# Patient Record
Sex: Male | Born: 1955 | Race: Black or African American | Hispanic: No | Marital: Married | Smoking: Never smoker
Health system: Southern US, Community
[De-identification: ages and names within clinical notes are randomized; demographics above are authoritative.]

## PROBLEM LIST (undated history)

## (undated) DIAGNOSIS — E119 Type 2 diabetes mellitus without complications: Secondary | ICD-10-CM

---

## 2012-12-17 ENCOUNTER — Encounter (HOSPITAL_BASED_OUTPATIENT_CLINIC_OR_DEPARTMENT_OTHER): Payer: Self-pay

## 2012-12-17 ENCOUNTER — Inpatient Hospital Stay (HOSPITAL_BASED_OUTPATIENT_CLINIC_OR_DEPARTMENT_OTHER)
Admission: EM | Admit: 2012-12-17 | Discharge: 2012-12-20 | DRG: 584 | Disposition: A | Discharge status: Home Health | Payer: BC Managed Care – PPO | Attending: Internal Medicine | Admitting: Internal Medicine

## 2012-12-17 ENCOUNTER — Emergency Department (HOSPITAL_BASED_OUTPATIENT_CLINIC_OR_DEPARTMENT_OTHER): Payer: BC Managed Care – PPO

## 2012-12-17 DIAGNOSIS — D509 Iron deficiency anemia, unspecified: Secondary | ICD-10-CM

## 2012-12-17 DIAGNOSIS — J189 Pneumonia, unspecified organism: Secondary | ICD-10-CM | POA: Diagnosis present

## 2012-12-17 DIAGNOSIS — A419 Sepsis, unspecified organism: Principal | ICD-10-CM | POA: Diagnosis present

## 2012-12-17 DIAGNOSIS — E876 Hypokalemia: Secondary | ICD-10-CM | POA: Diagnosis present

## 2012-12-17 DIAGNOSIS — E872 Acidosis, unspecified: Secondary | ICD-10-CM | POA: Diagnosis present

## 2012-12-17 DIAGNOSIS — E119 Type 2 diabetes mellitus without complications: Secondary | ICD-10-CM

## 2012-12-17 DIAGNOSIS — Z794 Long term (current) use of insulin: Secondary | ICD-10-CM

## 2012-12-17 DIAGNOSIS — Z79899 Other long term (current) drug therapy: Secondary | ICD-10-CM

## 2012-12-17 DIAGNOSIS — D72829 Elevated white blood cell count, unspecified: Secondary | ICD-10-CM | POA: Diagnosis present

## 2012-12-17 HISTORY — DX: Type 2 diabetes mellitus without complications: E11.9

## 2012-12-17 LAB — BASIC METABOLIC PANEL
CO2: 24 mEq/L (ref 19–32)
Calcium: 9.2 mg/dL (ref 8.4–10.5)
Chloride: 89 mEq/L — ABNORMAL LOW (ref 96–112)
Creatinine, Ser: 1 mg/dL (ref 0.50–1.35)
Glucose, Bld: 375 mg/dL — ABNORMAL HIGH (ref 70–99)

## 2012-12-17 LAB — GLUCOSE, CAPILLARY: Glucose-Capillary: 276 mg/dL — ABNORMAL HIGH (ref 70–99)

## 2012-12-17 LAB — CBC WITH DIFFERENTIAL/PLATELET
Eosinophils Absolute: 0 10*3/uL (ref 0.0–0.7)
Eosinophils Relative: 0 % (ref 0–5)
HCT: 36.4 % — ABNORMAL LOW (ref 39.0–52.0)
Hemoglobin: 12.2 g/dL — ABNORMAL LOW (ref 13.0–17.0)
Lymphs Abs: 1.1 10*3/uL (ref 0.7–4.0)
MCH: 22.3 pg — ABNORMAL LOW (ref 26.0–34.0)
MCV: 66.5 fL — ABNORMAL LOW (ref 78.0–100.0)
Monocytes Relative: 7 % (ref 3–12)
RBC: 5.47 MIL/uL (ref 4.22–5.81)

## 2012-12-17 LAB — CG4 I-STAT (LACTIC ACID): Lactic Acid, Venous: 3.16 mmol/L — ABNORMAL HIGH (ref 0.5–2.2)

## 2012-12-17 MED ORDER — DEXTROSE 5 % IV SOLN
1.0000 g | Freq: Once | INTRAVENOUS | Status: AC
  Administered 2012-12-17: 1 g via INTRAVENOUS
  Filled 2012-12-17: qty 10

## 2012-12-17 MED ORDER — SODIUM CHLORIDE 0.9 % IV SOLN
INTRAVENOUS | Status: AC
  Administered 2012-12-17: 20:00:00 via INTRAVENOUS

## 2012-12-17 MED ORDER — IOHEXOL 350 MG/ML SOLN
100.0000 mL | Freq: Once | INTRAVENOUS | Status: AC | PRN
  Administered 2012-12-17: 100 mL via INTRAVENOUS

## 2012-12-17 MED ORDER — INSULIN ASPART 100 UNIT/ML ~~LOC~~ SOLN
0.0000 [IU] | Freq: Every day | SUBCUTANEOUS | Status: DC
  Administered 2012-12-17: 3 [IU] via SUBCUTANEOUS

## 2012-12-17 MED ORDER — DEXTROSE 5 % IV SOLN
500.0000 mg | INTRAVENOUS | Status: DC
  Administered 2012-12-18 – 2012-12-19 (×2): 500 mg via INTRAVENOUS
  Filled 2012-12-17 (×3): qty 500

## 2012-12-17 MED ORDER — ACETAMINOPHEN 325 MG PO TABS
650.0000 mg | ORAL_TABLET | Freq: Four times a day (QID) | ORAL | Status: DC | PRN
  Administered 2012-12-17 – 2012-12-19 (×2): 650 mg via ORAL
  Filled 2012-12-17 (×2): qty 2

## 2012-12-17 MED ORDER — ACETAMINOPHEN 325 MG PO TABS
650.0000 mg | ORAL_TABLET | Freq: Once | ORAL | Status: AC
  Administered 2012-12-17: 650 mg via ORAL

## 2012-12-17 MED ORDER — INSULIN ASPART 100 UNIT/ML ~~LOC~~ SOLN
0.0000 [IU] | Freq: Three times a day (TID) | SUBCUTANEOUS | Status: DC
  Administered 2012-12-18 (×2): 3 [IU] via SUBCUTANEOUS
  Administered 2012-12-18: 2 [IU] via SUBCUTANEOUS
  Administered 2012-12-19: 3 [IU] via SUBCUTANEOUS
  Administered 2012-12-19 (×2): 5 [IU] via SUBCUTANEOUS
  Administered 2012-12-20 (×3): 3 [IU] via SUBCUTANEOUS

## 2012-12-17 MED ORDER — DEXTROSE 5 % IV SOLN
1.0000 g | INTRAVENOUS | Status: DC
  Administered 2012-12-18 – 2012-12-19 (×2): 1 g via INTRAVENOUS
  Filled 2012-12-17 (×3): qty 10

## 2012-12-17 MED ORDER — SODIUM CHLORIDE 0.9 % IV SOLN
1000.0000 mL | INTRAVENOUS | Status: DC
  Administered 2012-12-17 – 2012-12-19 (×5): 1000 mL via INTRAVENOUS

## 2012-12-17 MED ORDER — SODIUM CHLORIDE 0.9 % IV SOLN
1000.0000 mL | Freq: Once | INTRAVENOUS | Status: AC
  Administered 2012-12-17: 1000 mL via INTRAVENOUS

## 2012-12-17 MED ORDER — DEXTROSE 5 % IV SOLN
500.0000 mg | Freq: Once | INTRAVENOUS | Status: AC
  Administered 2012-12-17: 500 mg via INTRAVENOUS
  Filled 2012-12-17: qty 500

## 2012-12-17 MED ORDER — ACETAMINOPHEN 325 MG PO TABS
650.0000 mg | ORAL_TABLET | Freq: Once | ORAL | Status: AC
  Administered 2012-12-17: 650 mg via ORAL
  Filled 2012-12-17: qty 2

## 2012-12-17 MED ORDER — ACETAMINOPHEN 325 MG PO TABS
ORAL_TABLET | ORAL | Status: AC
  Administered 2012-12-17: 650 mg via ORAL
  Filled 2012-12-17: qty 2

## 2012-12-17 MED ORDER — HEPARIN SODIUM (PORCINE) 5000 UNIT/ML IJ SOLN
5000.0000 [IU] | Freq: Three times a day (TID) | INTRAMUSCULAR | Status: DC
  Administered 2012-12-17 – 2012-12-20 (×8): 5000 [IU] via SUBCUTANEOUS
  Filled 2012-12-17 (×11): qty 1

## 2012-12-17 MED ORDER — SODIUM CHLORIDE 0.9 % IV BOLUS (SEPSIS)
1000.0000 mL | Freq: Once | INTRAVENOUS | Status: AC
  Administered 2012-12-17: 1000 mL via INTRAVENOUS

## 2012-12-17 NOTE — ED Notes (Signed)
Patient reports that he awoke feeling weak and tired. Since awakening has had 3 episodes of spitting up frank blood. No history of same. Denies noticing any blood in stool

## 2012-12-17 NOTE — Significant Event (Signed)
Called by Dr. Rosalia Hammers at [facility] Patient presented with 2-3/4 cirterion for sepsis, source likely pulmonary, has pink coloured sputum, fever chills Temp 103 on admit Blood pressure better after 2 liter Per Dr. Rosalia Hammers, patient mentating well, seems stable for SDU Discussed cas ewioth Dr. Herma Carson of C[facility]-states he might be stable enough tfor SDU based on parameters discussed- advises to call [facility] if patient decompensates   Accepted SDU, Tm 10--FM made aware  Pleas Koch, MD [facility] [phone]

## 2012-12-17 NOTE — ED Notes (Signed)
Jimmy Cruz is on metformin.  He was instructed to DC metformin until after 3pm Tuesday afternoon.  Jimmy Cruz was given a reminder sheet to take home  and I also documented it in his chart.

## 2012-12-17 NOTE — H&P (Signed)
[facility] History and Physical  Jimmy Cruz ZOX:[id number] DOB: [birth year] DOA: 12/17/2012  Referring physician: ED PCP: No primary provider on file.   Chief Complaint: Cough, hemoptysis  HPI: Jimmy Cruz is a 57 y.o. male who presents to the ED at [facility] with fever and hemoptysis.  Symptoms onset yesterday after getting home from work.  Started with fever and chills, then turned into hemoptysis at around 0500 this morning.  In the ED he was found to have a LLL PNA, CT ruled out PE, patient was also septic with fever 103.1, P 128, tachypnea.  His BP was initially borderline low at 99/63 this improved with IVF, he was transferred to SDU at [facility] for further management.  Review of Systems: 12 systems reviewed and otherwise negative.  Past Medical History  Diagnosis Date  . Diabetes mellitus without complication    History reviewed. No pertinent past surgical history. Social History:  reports that he has never smoked. He does not have any smokeless tobacco history on file. He reports that he does not drink alcohol. His drug history is not on file. Works as [employer].  No Known Allergies  No family history on file. No recent illness in family.  Prior to Admission medications   Medication Sig Start Date End Date Taking? Authorizing Provider  metFORMIN (GLUCOPHAGE) 500 MG tablet Take 500 mg by mouth 2 (two) times daily with a meal.   Yes Historical Provider, MD  sitaGLIPtin (JANUVIA) 50 MG tablet Take 50 mg by mouth daily.    Yes Historical Provider, MD   Physical Exam: Filed Vitals:   12/17/12 1900  BP: 104/73  Pulse: 113  Temp: 99.3 F (37.4 C)  Resp: 18    General:  NAD, resting comfortably in bed Eyes: PEERLA EOMI ENT: mucous membranes moist Neck: supple w/o JVD Cardiovascular: tachycardic, regular, w/o MRG Respiratory: CTA B Abdomen: soft, nt, nd, bs+ Skin: no rash nor lesion Musculoskeletal: MAE, full ROM all 4 extremities Psychiatric: normal tone and  affect Neurologic: AAOx3, grossly non-focal  Labs on Admission:  Basic Metabolic Panel:  Recent Labs Lab 12/17/12 1250  NA 128*  K 3.8  CL 89*  CO2 24  GLUCOSE 375*  BUN 17  CREATININE 1.00  CALCIUM 9.2   Liver Function Tests: No results found for this basename: AST, ALT, ALKPHOS, BILITOT, PROT, ALBUMIN,  in the last 168 hours No results found for this basename: LIPASE, AMYLASE,  in the last 168 hours No results found for this basename: AMMONIA,  in the last 168 hours CBC:  Recent Labs Lab 12/17/12 1250  WBC 11.4*  NEUTROABS 9.5*  HGB 12.2*  HCT 36.4*  MCV 66.5*  PLT 146*   Cardiac Enzymes:  Recent Labs Lab 12/17/12 1250  TROPONINI <0.30    BNP (last 3 results) No results found for this basename: PROBNP,  in the last 8760 hours CBG:  Recent Labs Lab 12/17/12 1648  GLUCAP 351*    Radiological Exams on Admission: Dg Chest 2 View  12/17/2012   *RADIOLOGY REPORT*  Clinical Data: Hemoptysis.  CHEST - 2 VIEW  Comparison: None.  Findings: Lungs are adequately inflated with consolidation over the posterior left lower lobe.  The no definite effusion and no evidence of pneumothorax.  Cardiomediastinal silhouette is within normal.  Remaining bones and soft tissues are within normal.  IMPRESSION: Nonspecific consolidation over the posterior left lower lobe.  This may be due to infection versus hemorrhage and less likely neoplasm or infarction.   Original  Report Authenticated By: Elberta Fortis, M.D.   Ct Angio Chest Pe W/cm &/or Wo Cm  12/17/2012   *RADIOLOGY REPORT*  Clinical Data: Hemoptysis.  CT ANGIOGRAPHY CHEST  Technique:  Multidetector CT imaging of the chest using the standard protocol during bolus administration of intravenous contrast. Multiplanar reconstructed images including MIPs were obtained and reviewed to evaluate the vascular anatomy.  Contrast: OMNIPAQUE IOHEXOL 350 MG/ML SOLN  Comparison: Chest x-ray today  Findings: Lungs are well inflated and  demonstrate moderate consolidation with air bronchograms over most of the basilar segments of the left lower lobe.  No evidence of effusion. Findings are likely due to infection.  Heart is normal in size. There is no evidence of pulmonary embolism.  There is no mediastinal, hilar or axillary adenopathy.  Remaining mediastinal structures are unremarkable.  Images through the upper abdomen are within normal.  IMPRESSION: Moderate consolidation with air bronchograms over the left lower lobe likely infection. Recommend follow-up to resolution.  No evidence of pulmonary embolism.   Original Report Authenticated By: Elberta Fortis, M.D.    EKG: Independently reviewed.  Assessment/Plan Principal Problem:   CAP (community acquired pneumonia) Active Problems:   DM2 (diabetes mellitus, type 2)   Sepsis   1. CAP with sepsis - patient with apparent LLL PNA, treating as CAP, got rocephin and azithro at [facility], continuing this, on PNA pathway.  Tylenol for fever, got 2L IVF already, NS at 150 cc/hr for first 12 H then 125 cc/hr after that.  Fever down to 99.3 from 103.1 initially, tachycardia down to 113 from 128 initially.  Sating 99% on [place].  Lung sounds unimpressive at this point, suppress cough if it becomes too severe but dosent seem overly severe at this time.  Blood and sputum cultures pending. 2. DM2 - holding home PO meds and putting him on med dose SSI AC/HS, adding an HS correction dose for this evening since BGL was 351 most recently. 3. Lactic acidosis - q6h lactates ordered, next due at 11pm.    Code Status: Full Code (must indicate code status--if unknown or must be presumed, indicate so) Family Communication: Spoke with wife at bedside (indicate person spoken with, if applicable, with phone number if by telephone) Disposition Plan: Admit to inpatient (indicate anticipated LOS)  Time spent: 70 min  Radek Carnero M. [facility] Pager [phone]  If 7PM-7AM, please contact  night-coverage [email or web address] Password [email or web address] 12/17/2012, 8:02 PM

## 2012-12-17 NOTE — ED Provider Notes (Signed)
CSN: [id number]     Arrival date & time 12/17/12  1052 History   First MD Initiated Contact with Patient 12/17/12 1158     Chief Complaint  Patient presents with  . Hemoptysis   (Consider location/radiation/quality/duration/timing/severity/associated sxs/prior Treatment) HPI  57 y.o. Male with niddm presents complaining of fever and hemoptysis.  Patient worked yesterday felt ok, got home and had fever (subjective), then chills.  No headache, sore throat, n//v/d until 0500 when he got up and coughed with bloody sputum with mucous.  PMD is in [place].  No smoking.  No hsitory of dvt, pe, denies swelling in legs but states pain in legs comes and goes, denies any today.  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    Past Medical History  Diagnosis Date  . Diabetes mellitus without complication    History reviewed. No pertinent past surgical history. No family history on file. History  Substance Use Topics  . Smoking status: Never Smoker   . Smokeless tobacco: Not on file  . Alcohol Use: No    Review of Systems  All other systems reviewed and are negative.    Allergies  Review of patient's allergies indicates no known allergies.  Home Medications   Current Outpatient Rx  Name  Route  Sig  Dispense  Refill  . metFORMIN (GLUCOPHAGE) 500 MG tablet   Oral   Take 500 mg by mouth 2 (two) times daily with a meal.         . sitaGLIPtin (JANUVIA) 50 MG tablet   Oral   Take by mouth daily.          BP 105/69  Pulse 128  Temp(Src) 103.1 F (39.5 C) (Oral)  Resp 20  Wt 172 lb 6.4 oz (78.2 kg)  SpO2 92% Physical Exam  Nursing note and vitals reviewed. Constitutional: He is oriented to person, place, and time. He appears well-developed and well-nourished.  HENT:  Head: Normocephalic and atraumatic.  Right Ear: External ear normal.  Left Ear: External ear normal.  Nose: Nose normal.  Mouth/Throat: Oropharynx is clear and moist.   Eyes: Conjunctivae and EOM are normal. Pupils are equal, round, and reactive to light.  Neck: Normal range of motion. Neck supple.  Cardiovascular: Tachycardia present.   Pulmonary/Chest: Effort normal.  Abdominal: Soft. Bowel sounds are normal.  Musculoskeletal: Normal range of motion.  Neurological: He is alert and oriented to person, place, and time. He has normal reflexes.  Skin: Skin is warm and dry.  Psychiatric: He has a normal mood and affect. His behavior is normal. Thought content normal.    ED Course  Procedures (including critical care time) Labs Review Labs Reviewed  CULTURE, BLOOD (ROUTINE X 2)  CULTURE, BLOOD (ROUTINE X 2)  CBC WITH DIFFERENTIAL  BASIC METABOLIC PANEL   Imaging Review Dg Chest 2 View  12/17/2012   *RADIOLOGY REPORT*  Clinical Data: Hemoptysis.  CHEST - 2 VIEW  Comparison: None.  Findings: Lungs are adequately inflated with consolidation over the posterior left lower lobe.  The no definite effusion and no evidence of pneumothorax.  Cardiomediastinal silhouette is within normal.  Remaining bones and soft tissues are within normal.  IMPRESSION: Nonspecific consolidation over the posterior left lower lobe.  This may be due to infection versus hemorrhage and less likely neoplasm or infarction.   Original Report Authenticated By: Elberta Fortis, M.D.   Results for orders placed during the hospital encounter of 12/17/12  CBC  WITH DIFFERENTIAL      Result Value Range   WBC 11.4 (*) 4.0 - 10.5 K/uL   RBC 5.47  4.22 - 5.81 MIL/uL   Hemoglobin 12.2 (*) 13.0 - 17.0 g/dL   HCT 16.1 (*) 09.6 - 04.5 %   MCV 66.5 (*) 78.0 - 100.0 fL   MCH 22.3 (*) 26.0 - 34.0 pg   MCHC 33.5  30.0 - 36.0 g/dL   RDW 40.9  81.1 - 91.4 %   Platelets 146 (*) 150 - 400 K/uL   Neutrophils Relative % 84 (*) 43 - 77 %   Neutro Abs 9.5 (*) 1.7 - 7.7 K/uL   Lymphocytes Relative 10 (*) 12 - 46 %   Lymphs Abs 1.1  0.7 - 4.0 K/uL   Monocytes Relative 7  3 - 12 %   Monocytes Absolute 0.8  0.1  - 1.0 K/uL   Eosinophils Relative 0  0 - 5 %   Eosinophils Absolute 0.0  0.0 - 0.7 K/uL   Basophils Relative 0  0 - 1 %   Basophils Absolute 0.0  0.0 - 0.1 K/uL   RBC Morphology POLYCHROMASIA PRESENT    BASIC METABOLIC PANEL      Result Value Range   Sodium 128 (*) 135 - 145 mEq/L   Potassium 3.8  3.5 - 5.1 mEq/L   Chloride 89 (*) 96 - 112 mEq/L   CO2 24  19 - 32 mEq/L   Glucose, Bld 375 (*) 70 - 99 mg/dL   BUN 17  6 - 23 mg/dL   Creatinine, Ser 7.82  0.50 - 1.35 mg/dL   Calcium 9.2  8.4 - 95.6 mg/dL   GFR calc non Af Amer 83 (*) >90 mL/min   GFR calc Af Amer >90  >90 mL/min  PROTIME-INR      Result Value Range   Prothrombin Time 15.2  11.6 - 15.2 seconds   INR 1.23  0.00 - 1.49  TROPONIN I      Result Value Range   Troponin I <0.30  <0.30 ng/mL  GLUCOSE, CAPILLARY      Result Value Range   Glucose-Capillary 351 (*) 70 - 99 mg/dL   Comment 1 Notify RN    CG4 I-STAT (LACTIC ACID)      Result Value Range   Lactic Acid, Venous 3.16 (*) 0.5 - 2.2 mmol/L  CG4 I-STAT (LACTIC ACID)      Result Value Range   Lactic Acid, Venous 3.98 (*) 0.5 - 2.2 mmol/L   Filed Vitals:   12/17/12 1639  BP:   Pulse:   Temp: 102.2 F (39 C)  Resp:    Filed Vitals:   12/17/12 1530 12/17/12 1557 12/17/12 1630 12/17/12 1639  BP: 132/82  99/63   Pulse: 123  121   Temp:  101.5 F (38.6 C)  102.2 F (39 C)  TempSrc:  Oral  Oral  Resp: 30  0   Weight:      SpO2: 95%  99%      Mr. Macrae is a 57 year old male with a history of diabetes who presents today with fever starting last night and cough that began in the early a.m. He has a left lower lobe pneumonia, tachycardia, and fever consistent with sepsis.  The patient was treated with the standard medications for community-acquired pneumonia of Rocephin and Zithromax. Patient has received 3 L of fluid. His heart rate has come down with temperature defervescence  his heart rate was initially 1:30 he  has decreased to 117. He has had some  intermittent increase in temperature back up to 102.8 but again received Tylenol and body temperature is decreasing. Heart rate has been currently decreased to 117. Systolic blood pressure has ranged 99-120. Most recent systolic blood pressure was 117. He has received a total of 3.4 L at this time.  Patient's care was discussed with Dr. Mahala Menghini and arrangements have been made for the patient to be transferred to the step down unit at [facility]. I have continued close observation of the patient to assess for change in neurological status, tachycardia, and hypotension. Repeat lactic acid obtained is 3.98. Awaiting a repeat blood sugar. Patient has remained relatively stable and although meeting criteria for sepsis is not hypotensive at this time. He has good peripheral IV access and plans are to transfer him to step down but he is being carefully observed for possible need for ICU.  CRITICAL CARE Performed by: Hilario Quarry Total critical care time: 60 Critical care time was exclusive of separately billable procedures and treating other patients. Critical care was necessary to treat or prevent imminent or life-threatening deterioration. Critical care was time spent personally by me on the following activities: development of treatment plan with patient and/or surrogate as well as nursing, discussions with consultants, evaluation of patient's response to treatment, examination of patient, obtaining history from patient or surrogate, ordering and performing treatments and interventions, ordering and review of laboratory studies, ordering and review of radiographic studies, pulse oximetry and re-evaluation of patient's condition.    Hilario Quarry, MD 12/17/12 1728

## 2012-12-18 LAB — BASIC METABOLIC PANEL
CO2: 23 mEq/L (ref 19–32)
Chloride: 104 mEq/L (ref 96–112)
Creatinine, Ser: 0.78 mg/dL (ref 0.50–1.35)
GFR calc Af Amer: >90 mL/min (ref >90)
Potassium: 3.5 mEq/L (ref 3.5–5.1)
Sodium: 136 mEq/L (ref 135–145)

## 2012-12-18 LAB — LACTIC ACID, PLASMA: Lactic Acid, Venous: 1.3 mmol/L (ref 0.5–2.2)

## 2012-12-18 LAB — CBC
Platelets: 120 10*3/uL — ABNORMAL LOW (ref 150–400)
RBC: 4.57 MIL/uL (ref 4.22–5.81)
RDW: 14.4 % (ref 11.5–15.5)
WBC: 12.4 10*3/uL — ABNORMAL HIGH (ref 4.0–10.5)

## 2012-12-18 LAB — GLUCOSE, CAPILLARY
Glucose-Capillary: 154 mg/dL — ABNORMAL HIGH (ref 70–99)
Glucose-Capillary: 185 mg/dL — ABNORMAL HIGH (ref 70–99)

## 2012-12-18 LAB — STREP PNEUMONIAE URINARY ANTIGEN: Strep Pneumo Urinary Antigen: NEGATIVE

## 2012-12-18 MED ORDER — VANCOMYCIN HCL IN DEXTROSE 750-5 MG/150ML-% IV SOLN
750.0000 mg | INTRAVENOUS | Status: AC
  Administered 2012-12-18: 750 mg via INTRAVENOUS
  Filled 2012-12-18: qty 150

## 2012-12-18 MED ORDER — VANCOMYCIN HCL IN DEXTROSE 750-5 MG/150ML-% IV SOLN
750.0000 mg | Freq: Three times a day (TID) | INTRAVENOUS | Status: DC
  Administered 2012-12-18 – 2012-12-20 (×5): 750 mg via INTRAVENOUS
  Filled 2012-12-18 (×7): qty 150

## 2012-12-18 NOTE — Progress Notes (Signed)
Inpatient Diabetes Program Recommendations  AACE/ADA: New Consensus Statement on Inpatient Glycemic Control (2013)  Target Ranges:  Prepandial:   less than 140 mg/dL      Peak postprandial:   less than 180 mg/dL (1-2 hours)      Critically ill patients:  140 - 180 mg/dL   Reason for Visit: Results for Jimmy Cruz, Jimmy Cruz (MRN [id number]) as of 12/18/2012 10:55  Ref. Range 12/17/2012 16:48 12/17/2012 21:39 12/18/2012 07:57  Glucose-Capillary Latest Range: 70-99 mg/dL 409 (H) 811 (H) 914 (H)    Note CBG's elevated on admit.  May consider adding Levemir 15 units daily if CBG's continue greater than 140 mg/dL.  Also please check A1C to determine pre-hospitalization glycemic control.

## 2012-12-18 NOTE — Progress Notes (Signed)
Chaplain responded to page from nurse. Family had requested Bible and chaplain delivered it. Family did not seem to have other needs at this time.

## 2012-12-18 NOTE — Progress Notes (Signed)
ANTIBIOTIC CONSULT NOTE - INITIAL  Pharmacy Consult for Vancomycin Indication: pneumonia  No Known Allergies  Patient Measurements: Height: 5\' 7"  (170.2 cm) Weight: 172 lb 6.4 oz (78.2 kg) IBW/kg (Calculated) : 66.1 Adjusted Body Weight:   Vital Signs: Temp: 99.7 F (37.6 C) (09/08 1200) Temp src: Oral (09/08 1200) BP: 114/70 mmHg (09/08 1200) Pulse Rate: 109 (09/08 1200) Intake/Output from previous day: 09/07 0701 - 09/08 0700 In: 2020 [P.O.:220; I.V.:1800] Out: 1602 [Urine:1601; Stool:1] Intake/Output from this shift: Total I/O In: 1585 [P.O.:960; I.V.:625] Out: 600 [Urine:600]  Labs:  Recent Labs  12/17/12 1250 12/18/12 0501  WBC 11.4* 12.4*  HGB 12.2* 10.2*  PLT 146* 120*  CREATININE 1.00 0.78   Estimated Creatinine Clearance: 98.7 ml/min (by C-G formula based on Cr of 0.78). No results found for this basename: VANCOTROUGH, VANCOPEAK, VANCORANDOM, GENTTROUGH, GENTPEAK, GENTRANDOM, TOBRATROUGH, TOBRAPEAK, TOBRARND, AMIKACINPEAK, AMIKACINTROU, AMIKACIN,  in the last 72 hours   Microbiology: Recent Results (from the past 720 hour(s))  CULTURE, BLOOD (ROUTINE X 2)     Status: None   Collection Time    12/17/12 12:50 PM      Result Value Range Status   Specimen Description BLOOD RIGHT ARM   Final   Special Requests BOTTLES DRAWN AEROBIC AND ANAEROBIC 5CC EACH   Final   Culture  Setup Time     Final   Value: 12/17/2012 17:21     Performed at [facility]   Culture     Final   Value:        BLOOD CULTURE RECEIVED NO GROWTH TO DATE CULTURE WILL BE HELD FOR 5 DAYS BEFORE ISSUING A FINAL NEGATIVE REPORT     Performed at [facility]   Report Status PENDING   Incomplete  CULTURE, BLOOD (ROUTINE X 2)     Status: None   Collection Time    12/17/12  1:15 PM      Result Value Range Status   Specimen Description BLOOD LEFT HAND   Final   Special Requests BOTTLES DRAWN AEROBIC AND ANAEROBIC 5CC EACH   Final   Culture  Setup Time     Final   Value:  12/17/2012 17:21     Performed at [facility]   Culture     Final   Value:        BLOOD CULTURE RECEIVED NO GROWTH TO DATE CULTURE WILL BE HELD FOR 5 DAYS BEFORE ISSUING A FINAL NEGATIVE REPORT     Performed at [facility]   Report Status PENDING   Incomplete  MRSA PCR SCREENING     Status: None   Collection Time    12/17/12  7:27 PM      Result Value Range Status   MRSA by PCR NEGATIVE  NEGATIVE Final   Comment:            The GeneXpert MRSA Assay (FDA     approved for NASAL specimens     only), is one component of a     comprehensive MRSA colonization     surveillance program. It is not     intended to diagnose MRSA     infection nor to guide or     monitor treatment for     MRSA infections.    Medical History: Past Medical History  Diagnosis Date  . Diabetes mellitus without complication     Medications:  Scheduled:  . azithromycin  500 mg Intravenous Q24H  . cefTRIAXone (ROCEPHIN)  IV  1 g Intravenous Q24H  . heparin  5,000 Units Subcutaneous Q8H  . insulin aspart  0-15 Units Subcutaneous TID WC  . insulin aspart  0-5 Units Subcutaneous QHS   Assessment: 57yo male with LLL pna, hemoptysis and T 103.1 upon presentation to ED last PM.  Pt on Rocephin and Azithromycin, Vancomycin to be added.  He did not receive Vancomycin in ED.  Est CrCl > 100 with Cr < 1.  Goal of Therapy:  Vancomycin trough level 15-20 mcg/ml  Plan:  1.  Vancomycin 750mg  IV q8 2.  SS trough 3.  Monitor renal fxn and f/u cx results  Marisue Humble, PharmD Clinical Pharmacist [facility]

## 2012-12-18 NOTE — Progress Notes (Addendum)
[facility] PROGRESS NOTE  Jimmy Cruz ZOX:[id number] DOB: October 06, 1955 DOA: 12/17/2012 PCP: No primary provider on file.  Assessment/Plan: 1. Sepsis with CAP 1. Remains septic 2. Currently on rocephin and azithro 3. Cont volume resuscitation as tolerated 4. Lactate trending down 5. Remains with leukocytosis with fevers overnight and tachycardia 6. Will add vanco 2. DM 1. Cont SSI 2. Will monitor BS - if remains elevated, would add lantus per Diabetes recs 3. Will check a1c 3. DVT prophylaxis 1. Cont heparin subQ  Code Status: Full Family Communication: Pt in room (indicate person spoken with, relationship, and if by phone, the number) Disposition Plan: Pending  Antibiotics:  Rocephin 12/17/12>>>  Azithromycin 12/17/12>>>   Vancomycin 12/18/12>>>  HPI/Subjective: No complaints.  Objective: Filed Vitals:   12/17/12 2022 12/17/12 2341 12/18/12 0256 12/18/12 0800  BP: 108/74 102/75 104/71 98/65  Pulse: 113 113 101 108  Temp: 99.4 F (37.4 C) 100.6 F (38.1 C) 98.6 F (37 C) 99.7 F (37.6 C)  TempSrc: Oral Oral Oral Oral  Resp: 31 31 25 23   Height:    5\' 7"  (1.702 m)  Weight:    78.2 kg (172 lb 6.4 oz)  SpO2: 99% 99% 100% 97%    Intake/Output Summary (Last 24 hours) at 12/18/12 0905 Last data filed at 12/18/12 0800  Gross per 24 hour  Intake   2145 ml  Output   1102 ml  Net   1043 ml   Filed Weights   12/17/12 1105 12/18/12 0800  Weight: 78.2 kg (172 lb 6.4 oz) 78.2 kg (172 lb 6.4 oz)    Exam:  General:  Awake, in nad  Cardiovascular: Regular, s1, s2  Respiratory: normal resp effort, no wheezing  Abdomen: soft, nondistended  Musculoskeletal: perfused, no clubbing   Data Reviewed: Basic Metabolic Panel:  Recent Labs Lab 12/17/12 1250 12/18/12 0501  NA 128* 136  K 3.8 3.5  CL 89* 104  CO2 24 23  GLUCOSE 375* 178*  BUN 17 11  CREATININE 1.00 0.78  CALCIUM 9.2 7.7*   Liver Function Tests: No results found for this basename: AST,  ALT, ALKPHOS, BILITOT, PROT, ALBUMIN,  in the last 168 hours No results found for this basename: LIPASE, AMYLASE,  in the last 168 hours No results found for this basename: AMMONIA,  in the last 168 hours CBC:  Recent Labs Lab 12/17/12 1250 12/18/12 0501  WBC 11.4* 12.4*  NEUTROABS 9.5*  --   HGB 12.2* 10.2*  HCT 36.4* 30.7*  MCV 66.5* 67.2*  PLT 146* 120*   Cardiac Enzymes:  Recent Labs Lab 12/17/12 1250  TROPONINI <0.30   BNP (last 3 results) No results found for this basename: PROBNP,  in the last 8760 hours CBG:  Recent Labs Lab 12/17/12 1648 12/17/12 2139 12/18/12 0757  GLUCAP 351* 276* 154*    Recent Results (from the past 240 hour(s))  MRSA PCR SCREENING     Status: None   Collection Time    12/17/12  7:27 PM      Result Value Range Status   MRSA by PCR NEGATIVE  NEGATIVE Final   Comment:            The GeneXpert MRSA Assay (FDA     approved for NASAL specimens     only), is one component of a     comprehensive MRSA colonization     surveillance program. It is not     intended to diagnose MRSA     infection nor to  guide or     monitor treatment for     MRSA infections.     Studies: Dg Chest 2 View  12/17/2012   *RADIOLOGY REPORT*  Clinical Data: Hemoptysis.  CHEST - 2 VIEW  Comparison: None.  Findings: Lungs are adequately inflated with consolidation over the posterior left lower lobe.  The no definite effusion and no evidence of pneumothorax.  Cardiomediastinal silhouette is within normal.  Remaining bones and soft tissues are within normal.  IMPRESSION: Nonspecific consolidation over the posterior left lower lobe.  This may be due to infection versus hemorrhage and less likely neoplasm or infarction.   Original Report Authenticated By: Elberta Fortis, M.D.   Ct Angio Chest Pe W/cm &/or Wo Cm  12/17/2012   *RADIOLOGY REPORT*  Clinical Data: Hemoptysis.  CT ANGIOGRAPHY CHEST  Technique:  Multidetector CT imaging of the chest using the standard protocol  during bolus administration of intravenous contrast. Multiplanar reconstructed images including MIPs were obtained and reviewed to evaluate the vascular anatomy.  Contrast: OMNIPAQUE IOHEXOL 350 MG/ML SOLN  Comparison: Chest x-ray today  Findings: Lungs are well inflated and demonstrate moderate consolidation with air bronchograms over most of the basilar segments of the left lower lobe.  No evidence of effusion. Findings are likely due to infection.  Heart is normal in size. There is no evidence of pulmonary embolism.  There is no mediastinal, hilar or axillary adenopathy.  Remaining mediastinal structures are unremarkable.  Images through the upper abdomen are within normal.  IMPRESSION: Moderate consolidation with air bronchograms over the left lower lobe likely infection. Recommend follow-up to resolution.  No evidence of pulmonary embolism.   Original Report Authenticated By: Elberta Fortis, M.D.    Scheduled Meds: . azithromycin  500 mg Intravenous Q24H  . cefTRIAXone (ROCEPHIN)  IV  1 g Intravenous Q24H  . heparin  5,000 Units Subcutaneous Q8H  . insulin aspart  0-15 Units Subcutaneous TID WC  . insulin aspart  0-5 Units Subcutaneous QHS   Continuous Infusions: . sodium chloride Stopped (12/17/12 1704)    Principal Problem:   CAP (community acquired pneumonia) Active Problems:   DM2 (diabetes mellitus, type 2)   Sepsis  Time spent:  Lavon Bothwell K  [facility] Pager [phone]. If 7PM-7AM, please contact night-coverage at [email or web address], password [facility] 12/18/2012, 9:05 AM  LOS: 1 day

## 2012-12-18 NOTE — Progress Notes (Signed)
Nutrition Brief Note  Patient identified on the Malnutrition Screening Tool (MST) Report for unintentional weight loss. Patient reports that he was eating well PTA. He thinks that his weight loss was related to increased activity/exercise. Nutrition focused physical exam completed.  No muscle or subcutaneous fat depletion noticed.  Wt Readings from Last 15 Encounters:  12/18/12 172 lb 6.4 oz (78.2 kg)   Usual weight ~195 lb.  Body mass index is 27 kg/(m^2). Patient meets criteria for overweight based on current BMI.   Current diet order is CHO-modified medium, patient is tolerating diet well at this time; did not like breakfast this morning. Discussed different breakfast options with patient. Labs and medications reviewed.   No nutrition interventions warranted at this time. If nutrition issues arise, please consult RD.   Joaquin Courts, RD, LDN, CNSC Pager [phone] After Hours Pager [phone]

## 2012-12-18 NOTE — Progress Notes (Signed)
Utilization review completed.  

## 2012-12-19 DIAGNOSIS — A419 Sepsis, unspecified organism: Secondary | ICD-10-CM

## 2012-12-19 DIAGNOSIS — D509 Iron deficiency anemia, unspecified: Secondary | ICD-10-CM

## 2012-12-19 DIAGNOSIS — E119 Type 2 diabetes mellitus without complications: Secondary | ICD-10-CM

## 2012-12-19 DIAGNOSIS — J189 Pneumonia, unspecified organism: Secondary | ICD-10-CM

## 2012-12-19 LAB — BASIC METABOLIC PANEL
BUN: 8 mg/dL (ref 6–23)
CO2: 22 mEq/L (ref 19–32)
Chloride: 102 mEq/L (ref 96–112)
Creatinine, Ser: 0.71 mg/dL (ref 0.50–1.35)

## 2012-12-19 LAB — CBC WITH DIFFERENTIAL/PLATELET
Basophils Relative: 0 % (ref 0–1)
Eosinophils Absolute: 0.1 10*3/uL (ref 0.0–0.7)
HCT: 30.9 % — ABNORMAL LOW (ref 39.0–52.0)
Hemoglobin: 10.4 g/dL — ABNORMAL LOW (ref 13.0–17.0)
Lymphocytes Relative: 20 % (ref 12–46)
Lymphs Abs: 2 10*3/uL (ref 0.7–4.0)
MCHC: 33.7 g/dL (ref 30.0–36.0)
MCV: 66.6 fL — ABNORMAL LOW (ref 78.0–100.0)
Neutro Abs: 7.2 10*3/uL (ref 1.7–7.7)

## 2012-12-19 LAB — LEGIONELLA ANTIGEN, URINE

## 2012-12-19 LAB — GLUCOSE, CAPILLARY
Glucose-Capillary: 229 mg/dL — ABNORMAL HIGH (ref 70–99)
Glucose-Capillary: 165 mg/dL — ABNORMAL HIGH (ref 70–99)

## 2012-12-19 MED ORDER — POTASSIUM CHLORIDE CRYS ER 20 MEQ PO TBCR
40.0000 meq | EXTENDED_RELEASE_TABLET | Freq: Once | ORAL | Status: AC
  Administered 2012-12-19: 40 meq via ORAL
  Filled 2012-12-19: qty 2

## 2012-12-19 MED ORDER — INSULIN GLARGINE 100 UNIT/ML ~~LOC~~ SOLN
15.0000 [IU] | Freq: Every day | SUBCUTANEOUS | Status: DC
  Administered 2012-12-19 – 2012-12-20 (×2): 15 [IU] via SUBCUTANEOUS
  Filled 2012-12-19 (×2): qty 0.15

## 2012-12-19 MED ORDER — POTASSIUM CHLORIDE CRYS ER 20 MEQ PO TBCR: 40.0000 meq | EXTENDED_RELEASE_TABLET | Freq: Two times a day (BID) | ORAL | Status: DC

## 2012-12-19 NOTE — Progress Notes (Signed)
Rec Pt from 3s, Pt 04x no complaints of Pain or SOB. Will continue to monitor

## 2012-12-19 NOTE — Progress Notes (Signed)
[facility] PROGRESS NOTE  Jimmy Cruz UJW:[id number] DOB: [birth year] DOA: 12/17/2012 PCP: No primary provider on file.  Assessment/Plan: 1. Sepsis with CAP 1. Remains septic 2. Currently on rocephin, azithro, and vanc 3. Cont volume resuscitation as tolerated 4. Lactate trending down 5. Remains with mild tachycardia and low grade temps. 6. Leukocytosis resolved 7. Will transfer to med/tele floor 2. DM 1. Cont SSI 2. Would add lantus per Diabetes recs 3. DVT prophylaxis 1. Cont heparin subQ  Code Status: Full Family Communication: Pt in room (indicate person spoken with, relationship, and if by phone, the number) Disposition Plan: Pending  Antibiotics:  Rocephin 12/17/12>>>  Azithromycin 12/17/12>>>   Vancomycin 12/18/12>>>  HPI/Subjective: No complaints. No major events overnight. Reports feeling better this AM.  Objective: Filed Vitals:   12/18/12 2015 12/19/12 0025 12/19/12 0340 12/19/12 0500  BP: 116/77 120/72 120/84   Pulse: 113 115 110   Temp: 99.9 F (37.7 C) 99.1 F (37.3 C)  99.3 F (37.4 C)  TempSrc: Oral Oral  Oral  Resp: 32 28 26   Height:      Weight:      SpO2: 98% 95% 97%     Intake/Output Summary (Last 24 hours) at 12/19/12 0833 Last data filed at 12/19/12 0700  Gross per 24 hour  Intake   6265 ml  Output   3600 ml  Net   2665 ml   Filed Weights   12/17/12 1105 12/18/12 0800  Weight: 78.2 kg (172 lb 6.4 oz) 78.2 kg (172 lb 6.4 oz)    Exam:  General:  Awake, in nad  Cardiovascular: Regular, s1, s2  Respiratory: normal resp effort, no wheezing  Abdomen: soft, nondistended  Musculoskeletal: perfused, no clubbing   Data Reviewed: Basic Metabolic Panel:  Recent Labs Lab 12/17/12 1250 12/18/12 0501 12/19/12 0409  NA 128* 136 135  K 3.8 3.5 3.1*  CL 89* 104 102  CO2 24 23 22   GLUCOSE 375* 178* 196*  BUN 17 11 8   CREATININE 1.00 0.78 0.71  CALCIUM 9.2 7.7* 8.0*   Liver Function Tests: No results found for this  basename: AST, ALT, ALKPHOS, BILITOT, PROT, ALBUMIN,  in the last 168 hours No results found for this basename: LIPASE, AMYLASE,  in the last 168 hours No results found for this basename: AMMONIA,  in the last 168 hours CBC:  Recent Labs Lab 12/17/12 1250 12/18/12 0501 12/19/12 0409  WBC 11.4* 12.4* 9.9  NEUTROABS 9.5*  --  7.2  HGB 12.2* 10.2* 10.4*  HCT 36.4* 30.7* 30.9*  MCV 66.5* 67.2* 66.6*  PLT 146* 120* 130*   Cardiac Enzymes:  Recent Labs Lab 12/17/12 1250  TROPONINI <0.30   BNP (last 3 results) No results found for this basename: PROBNP,  in the last 8760 hours CBG:  Recent Labs Lab 12/17/12 2139 12/18/12 0757 12/18/12 1253 12/18/12 1635 12/18/12 2149  GLUCAP 276* 154* 144* 200* 185*    Recent Results (from the past 240 hour(s))  CULTURE, BLOOD (ROUTINE X 2)     Status: None   Collection Time    12/17/12 12:50 PM      Result Value Range Status   Specimen Description BLOOD RIGHT ARM   Final   Special Requests BOTTLES DRAWN AEROBIC AND ANAEROBIC [facility] EACH   Final   Culture  Setup Time     Final   Value: 12/17/2012 17:21     Performed at [facility]   Culture     Final  Value:        BLOOD CULTURE RECEIVED NO GROWTH TO DATE CULTURE WILL BE HELD FOR 5 DAYS BEFORE ISSUING A FINAL NEGATIVE REPORT     Performed at [facility]   Report Status PENDING   Incomplete  CULTURE, BLOOD (ROUTINE X 2)     Status: None   Collection Time    12/17/12  1:15 PM      Result Value Range Status   Specimen Description BLOOD LEFT HAND   Final   Special Requests BOTTLES DRAWN AEROBIC AND ANAEROBIC 5CC EACH   Final   Culture  Setup Time     Final   Value: 12/17/2012 17:21     Performed at [facility]   Culture     Final   Value:        BLOOD CULTURE RECEIVED NO GROWTH TO DATE CULTURE WILL BE HELD FOR 5 DAYS BEFORE ISSUING A FINAL NEGATIVE REPORT     Performed at [facility]   Report Status PENDING   Incomplete  MRSA PCR SCREENING      Status: None   Collection Time    12/17/12  7:27 PM      Result Value Range Status   MRSA by PCR NEGATIVE  NEGATIVE Final   Comment:            The GeneXpert MRSA Assay (FDA     approved for NASAL specimens     only), is one component of a     comprehensive MRSA colonization     surveillance program. It is not     intended to diagnose MRSA     infection nor to guide or     monitor treatment for     MRSA infections.     Studies: Dg Chest 2 View  12/17/2012   *RADIOLOGY REPORT*  Clinical Data: Hemoptysis.  CHEST - 2 VIEW  Comparison: None.  Findings: Lungs are adequately inflated with consolidation over the posterior left lower lobe.  The no definite effusion and no evidence of pneumothorax.  Cardiomediastinal silhouette is within normal.  Remaining bones and soft tissues are within normal.  IMPRESSION: Nonspecific consolidation over the posterior left lower lobe.  This may be due to infection versus hemorrhage and less likely neoplasm or infarction.   Original Report Authenticated By: Elberta Fortis, M.D.   Ct Angio Chest Pe W/cm &/or Wo Cm  12/17/2012   *RADIOLOGY REPORT*  Clinical Data: Hemoptysis.  CT ANGIOGRAPHY CHEST  Technique:  Multidetector CT imaging of the chest using the standard protocol during bolus administration of intravenous contrast. Multiplanar reconstructed images including MIPs were obtained and reviewed to evaluate the vascular anatomy.  Contrast: OMNIPAQUE IOHEXOL 350 MG/ML SOLN  Comparison: Chest x-ray today  Findings: Lungs are well inflated and demonstrate moderate consolidation with air bronchograms over most of the basilar segments of the left lower lobe.  No evidence of effusion. Findings are likely due to infection.  Heart is normal in size. There is no evidence of pulmonary embolism.  There is no mediastinal, hilar or axillary adenopathy.  Remaining mediastinal structures are unremarkable.  Images through the upper abdomen are within normal.  IMPRESSION: Moderate  consolidation with air bronchograms over the left lower lobe likely infection. Recommend follow-up to resolution.  No evidence of pulmonary embolism.   Original Report Authenticated By: Elberta Fortis, M.D.    Scheduled Meds: . azithromycin  500 mg Intravenous Q24H  . cefTRIAXone (ROCEPHIN)  IV  1  g Intravenous Q24H  . heparin  5,000 Units Subcutaneous Q8H  . insulin aspart  0-15 Units Subcutaneous TID WC  . insulin aspart  0-5 Units Subcutaneous QHS  . vancomycin  750 mg Intravenous Q8H   Continuous Infusions: . sodium chloride 1,000 mL (12/19/12 0351)    Principal Problem:   CAP (community acquired pneumonia) Active Problems:   DM2 (diabetes mellitus, type 2)   Sepsis  Time spent:  CHIU, STEPHEN K  [facility] Pager [phone]. If 7PM-7AM, please contact night-coverage at [email or web address], password [facility] 12/19/2012, 8:33 AM  LOS: 2 days

## 2012-12-19 NOTE — Progress Notes (Signed)
Pt K3.1 no k given at this time. MD paged . Will continue to monitor

## 2012-12-19 NOTE — Progress Notes (Signed)
Report called to Freida Busman, receiving RN on [address]. VSS. Transferred to [phone] via wheelchair with personal belongings. Called patient's family member with new room number.  [facility]

## 2012-12-20 DIAGNOSIS — E876 Hypokalemia: Secondary | ICD-10-CM

## 2012-12-20 DIAGNOSIS — D509 Iron deficiency anemia, unspecified: Secondary | ICD-10-CM

## 2012-12-20 LAB — GLUCOSE, CAPILLARY
Glucose-Capillary: 176 mg/dL — ABNORMAL HIGH (ref 70–99)
Glucose-Capillary: 191 mg/dL — ABNORMAL HIGH (ref 70–99)

## 2012-12-20 LAB — CBC WITH DIFFERENTIAL/PLATELET
Basophils Relative: 0 % (ref 0–1)
Eosinophils Relative: 4 % (ref 0–5)
HCT: 29.2 % — ABNORMAL LOW (ref 39.0–52.0)
Hemoglobin: 9.8 g/dL — ABNORMAL LOW (ref 13.0–17.0)
Lymphs Abs: 2.2 10*3/uL (ref 0.7–4.0)
MCH: 22.3 pg — ABNORMAL LOW (ref 26.0–34.0)
MCV: 66.5 fL — ABNORMAL LOW (ref 78.0–100.0)
Monocytes Absolute: 0.6 10*3/uL (ref 0.1–1.0)
Monocytes Relative: 9 % (ref 3–12)
RBC: 4.39 MIL/uL (ref 4.22–5.81)
WBC: 7.2 10*3/uL (ref 4.0–10.5)

## 2012-12-20 LAB — BASIC METABOLIC PANEL
CO2: 24 mEq/L (ref 19–32)
Calcium: 8.3 mg/dL — ABNORMAL LOW (ref 8.4–10.5)
Chloride: 105 mEq/L (ref 96–112)
Creatinine, Ser: 0.68 mg/dL (ref 0.50–1.35)
Glucose, Bld: 182 mg/dL — ABNORMAL HIGH (ref 70–99)

## 2012-12-20 MED ORDER — LEVOFLOXACIN 750 MG PO TABS: 750.0000 mg | ORAL_TABLET | Freq: Every day | ORAL | Status: AC

## 2012-12-20 MED ORDER — POTASSIUM CHLORIDE CRYS ER 20 MEQ PO TBCR
40.0000 meq | EXTENDED_RELEASE_TABLET | Freq: Once | ORAL | Status: AC
  Administered 2012-12-20: 11:00:00 40 meq via ORAL
  Filled 2012-12-20: qty 2

## 2012-12-20 MED ORDER — LIVING WELL WITH DIABETES BOOK
Freq: Once | Status: DC
  Filled 2012-12-20: qty 1

## 2012-12-20 MED ORDER — INSULIN GLARGINE 100 UNIT/ML SOLOSTAR PEN: 20.0000 [IU] | PEN_INJECTOR | Freq: Every day | SUBCUTANEOUS | Status: AC

## 2012-12-20 MED ORDER — SODIUM CHLORIDE 0.9 % IV SOLN: 1000.0000 mL | INTRAVENOUS | Status: DC

## 2012-12-20 MED ORDER — INSULIN PEN STARTER KIT
1.0000 | Freq: Once | Status: AC
  Administered 2012-12-20: 1
  Filled 2012-12-20 (×2): qty 1

## 2012-12-20 MED ORDER — BENZONATATE 200 MG PO CAPS: 200.0000 mg | ORAL_CAPSULE | Freq: Three times a day (TID) | ORAL | Status: AC | PRN

## 2012-12-20 MED ORDER — SODIUM CHLORIDE 0.9 % IV SOLN
1000.0000 mL | INTRAVENOUS | Status: DC
  Administered 2012-12-20: 09:00:00 1000 mL via INTRAVENOUS

## 2012-12-20 MED ORDER — INSULIN ASPART 100 UNIT/ML FLEXPEN: 5.0000 [IU] | PEN_INJECTOR | Freq: Three times a day (TID) | SUBCUTANEOUS | Status: AC

## 2012-12-20 NOTE — Progress Notes (Signed)
Inpatient Diabetes Program Recommendations  AACE/ADA: New Consensus Statement on Inpatient Glycemic Control (2013)  Target Ranges:  Prepandial:   less than 140 mg/dL      Peak postprandial:   less than 180 mg/dL (1-2 hours)      Critically ill patients:  140 - 180 mg/dL   Reason for Visit: Insulin Pen administration   Educated patient and spouse on insulin pen use at home. Reviewed contents of insulin flexpen starter kit. Reviewed all steps if insulin pen including attachment of needle, 2-unit air shot, dialing up dose, giving injection, removing needle, disposal of sharps, storage of unused insulin, disposal of insulin etc. Patient able to provide successful return demonstration. Also reviewed troubleshooting with insulin pen. MD to give patient Rxs for insulin pens and insulin pen needles.  Pt willing to go to OP Diabetes Education at [facility].  Has meter at home and currently checks blood sugars.  Instructed to take logbook to MD visit for review and adjustments with insulin regimen.  Answered questions. Pt seems motivated to control blood sugars with prescribed insulin.  Thank you. Ailene Ards, RD, LDN, CDE Inpatient Diabetes Coordinator  [phone]

## 2012-12-20 NOTE — Progress Notes (Signed)
Spoke with Case Manager and will speak with patient and his wife in regards to home health orders. Patient has demonstrated to diabetic coordinator of understanding and knowing how to use the injection pen. Patient has demonstrated to nurse and states he feels comfortable doing it. patient has watched diabetes videos. Patient discharged and will be going home once wife arrives.

## 2012-12-20 NOTE — Discharge Summary (Addendum)
Physician Discharge Summary  Jimmy Cruz RUE:[id number] DOB: 07-16-1955 DOA: 12/17/2012  PCP: No primary provider on file.  Admit date: 12/17/2012 Discharge date: 12/20/2012  Recommendations for Outpatient Follow-up:  1. Follow up with primary care doctor in 1 week for repeat evaluation.  Please follow up on pending blood cultures 9/7 x 2.  Anemia panel with occult stool/colonoscopy if not already complete.  T2DM management after starting insulin injections for home.  Stopped [place] for now and continued metformin.    Discharge Diagnoses:  Principal Problem:   CAP (community acquired pneumonia) Active Problems:   DM2 (diabetes mellitus, type 2)   Sepsis   Microcytic anemia   Hypokalemia   Discharge Condition: stable, improved  Diet recommendation: diabetic diet  Wt Readings from Last 3 Encounters:  12/20/12 81.285 kg (179 lb 3.2 oz)    History of present illness:  Jimmy Cruz is a 57 y.o. male who presents to the ED at [facility] with fever and hemoptysis. Symptoms onset yesterday after getting home from work. Started with fever and chills, then turned into hemoptysis at around 0500 this morning.  In the ED he was found to have a LLL PNA, CT ruled out PE, patient was also septic with fever 103.1, P 128, tachypnea. His BP was initially borderline low at 99/63 this improved with IVF, he was transferred to SDU at [facility] for further management.  Hospital Course:   Jimmy Cruz was admitted to the stepdown unit and was started on ceftriaxone, azithromycin for CAP.  He was given IVF and his fever and WBC trended down.  After 24-hours, however, his blood pressures remained somewhat low in the low 100s and high 90s systolic, so vancomycin was added.  His cultures have remained negative and his blood pressures have improved.  He had a mildly elevated lactic acid which improved with IV fluids.  He was placed in 2L nasal canula but has been on room air and ambulating without SOB for the last 48 hours.   Due to the quick onset and severity of his symptoms, will transition to levofloxacin to complete his course of antibiotics.    Hypokalemia, due to poor oral intake during period of illness.  Repleted with oral potassium.    T2DM, hgb A1c 13.5 on 12/19/2012.  He was started on lantus and meal time insulin.  He should continue metformin.  I will discontinue his [place] for now while transitioning to insulin and should have close follow up with his primary care doctor.  He had education by nursing staff, diabetic educator, and will have home health RN to assist with transition to injectable medication.  Microcytic anemia, with hgb trending down slightly.  May have some marrow suppression from acute illness, but would recommend occult stools and further anemia w/u by PCP if not already complete.    Procedures: CXR and CT angio chest on 12/17/2012  Consultations:  none  Discharge Exam: Filed Vitals:   12/20/12 0957  BP: 134/89  Pulse: 100  Temp: 99.1 F (37.3 C)  Resp: 20   Filed Vitals:   12/19/12 1804 12/19/12 2050 12/20/12 0535 12/20/12 0957  BP: 135/84 126/89 129/85 134/89  Pulse: 103 105 100 100  Temp: 98.9 F (37.2 C) 98.8 F (37.1 C) 99 F (37.2 C) 99.1 F (37.3 C)  TempSrc: Oral Oral Oral Oral  Resp: 18 19 19 20   Height:      Weight:   81.285 kg (179 lb 3.2 oz)   SpO2: 99% 97% 96% 97%  General:  AAM, no acute distress, sitting up comfortably in bed HEENT:  NCAT, MMM Cardiovascular: RRR, no mrg, 2+ pulses, warm extremities Respiratory:  Diminished, egophanous BS in the left base, some rales heard at the left mid-back, rest of fields CTA.   ABD:  NABS, soft, ND/NT MSK:  No LEE  Discharge Instructions      Discharge Orders   Future Orders Complete By Expires   Call MD for:  difficulty breathing, headache or visual disturbances  As directed    Call MD for:  extreme fatigue  As directed    Call MD for:  hives  As directed    Call MD for:  persistant dizziness or  light-headedness  As directed    Call MD for:  persistant nausea and vomiting  As directed    Call MD for:  severe uncontrolled pain  As directed    Call MD for:  temperature >100.4  As directed    Diet Carb Modified  As directed    Discharge instructions  As directed    Comments:     You were hospitalized with pneumonia which was likely made worse by your uncontrolled diabetes.  You were treated with IV antibiotics and should continue oral antibiotics for 5 more days to complete your course.  You have been given a prescription for cough medication also, but it is normal to have cough for several weeks after severe pneumonia.  Your diabetes is not well controlled. Your hemoglobin A1c is 13.5.  Please stop your [place] and start long acting insulin lantus once daily with some Jimmy Cruz acting insulin aspart before meals.  Please check fingersticks four times per day, before breakfast, lunch, dinner and bed and record the numbers in a journal.  Please bring this information with you to your primary care doctor in 1 week so they may adjust your insulin.  They will need the numbers in order to make appropriate changes in the doses.  Finally, you are anemic.  Please make sure your doctor has investigated the cause of your anemia.   Increase activity slowly  As directed        Medication List    STOP taking these medications       sitaGLIPtin 50 MG tablet  Commonly known as:  JANUVIA      TAKE these medications       benzonatate 200 MG capsule  Commonly known as:  TESSALON  Take 1 capsule (200 mg total) by mouth 3 (three) times daily as needed for cough.     insulin aspart 100 UNIT/ML Sopn FlexPen  Commonly known as:  novoLOG  Inject 5 Units into the skin 3 (three) times daily with meals.     Insulin Glargine 100 UNIT/ML Sopn  Commonly known as:  LANTUS SOLOSTAR  Inject 20 Units into the skin daily.     levofloxacin 750 MG tablet  Commonly known as:  LEVAQUIN  Take 1 tablet (750 mg total)  by mouth daily.     metFORMIN 500 MG tablet  Commonly known as:  GLUCOPHAGE  Take 500 mg by mouth 2 (two) times daily with a meal.          The results of significant diagnostics from this hospitalization (including imaging, microbiology, ancillary and laboratory) are listed below for reference.    Significant Diagnostic Studies: Dg Chest 2 View  12/17/2012   *RADIOLOGY REPORT*  Clinical Data: Hemoptysis.  CHEST - 2 VIEW  Comparison: None.  Findings: Lungs are  adequately inflated with consolidation over the posterior left lower lobe.  The no definite effusion and no evidence of pneumothorax.  Cardiomediastinal silhouette is within normal.  Remaining bones and soft tissues are within normal.  IMPRESSION: Nonspecific consolidation over the posterior left lower lobe.  This may be due to infection versus hemorrhage and less likely neoplasm or infarction.   Original Report Authenticated By: Elberta Fortis, M.D.   Ct Angio Chest Pe W/cm &/or Wo Cm  12/17/2012   *RADIOLOGY REPORT*  Clinical Data: Hemoptysis.  CT ANGIOGRAPHY CHEST  Technique:  Multidetector CT imaging of the chest using the standard protocol during bolus administration of intravenous contrast. Multiplanar reconstructed images including MIPs were obtained and reviewed to evaluate the vascular anatomy.  Contrast: OMNIPAQUE IOHEXOL 350 MG/ML SOLN  Comparison: Chest x-ray today  Findings: Lungs are well inflated and demonstrate moderate consolidation with air bronchograms over most of the basilar segments of the left lower lobe.  No evidence of effusion. Findings are likely due to infection.  Heart is normal in size. There is no evidence of pulmonary embolism.  There is no mediastinal, hilar or axillary adenopathy.  Remaining mediastinal structures are unremarkable.  Images through the upper abdomen are within normal.  IMPRESSION: Moderate consolidation with air bronchograms over the left lower lobe likely infection. Recommend follow-up to  resolution.  No evidence of pulmonary embolism.   Original Report Authenticated By: Elberta Fortis, M.D.    Microbiology: Recent Results (from the past 240 hour(s))  CULTURE, BLOOD (ROUTINE X 2)     Status: None   Collection Time    12/17/12 12:50 PM      Result Value Range Status   Specimen Description BLOOD RIGHT ARM   Final   Special Requests BOTTLES DRAWN AEROBIC AND ANAEROBIC 5CC EACH   Final   Culture  Setup Time     Final   Value: 12/17/2012 17:21     Performed at [facility]   Culture     Final   Value:        BLOOD CULTURE RECEIVED NO GROWTH TO DATE CULTURE WILL BE HELD FOR 5 DAYS BEFORE ISSUING A FINAL NEGATIVE REPORT     Performed at [facility]   Report Status PENDING   Incomplete  CULTURE, BLOOD (ROUTINE X 2)     Status: None   Collection Time    12/17/12  1:15 PM      Result Value Range Status   Specimen Description BLOOD LEFT HAND   Final   Special Requests BOTTLES DRAWN AEROBIC AND ANAEROBIC 5CC EACH   Final   Culture  Setup Time     Final   Value: 12/17/2012 17:21     Performed at [facility]   Culture     Final   Value:        BLOOD CULTURE RECEIVED NO GROWTH TO DATE CULTURE WILL BE HELD FOR 5 DAYS BEFORE ISSUING A FINAL NEGATIVE REPORT     Performed at [facility]   Report Status PENDING   Incomplete  MRSA PCR SCREENING     Status: None   Collection Time    12/17/12  7:27 PM      Result Value Range Status   MRSA by PCR NEGATIVE  NEGATIVE Final   Comment:            The GeneXpert MRSA Assay (FDA     approved for NASAL specimens     only), is one  component of a     comprehensive MRSA colonization     surveillance program. It is not     intended to diagnose MRSA     infection nor to guide or     monitor treatment for     MRSA infections.     Labs: Basic Metabolic Panel:  Recent Labs Lab 12/17/12 1250 12/18/12 0501 12/19/12 0409 12/20/12 0440  NA 128* 136 135 137  K 3.8 3.5 3.1* 3.2*  CL 89* 104 102 105  CO2  24 23 22 24   GLUCOSE 375* 178* 196* 182*  BUN 17 11 8 6   CREATININE 1.00 0.78 0.71 0.68  CALCIUM 9.2 7.7* 8.0* 8.3*   Liver Function Tests: No results found for this basename: AST, ALT, ALKPHOS, BILITOT, PROT, ALBUMIN,  in the last 168 hours No results found for this basename: LIPASE, AMYLASE,  in the last 168 hours No results found for this basename: AMMONIA,  in the last 168 hours CBC:  Recent Labs Lab 12/17/12 1250 12/18/12 0501 12/19/12 0409 12/20/12 0440  WBC 11.4* 12.4* 9.9 7.2  NEUTROABS 9.5*  --  7.2 4.1  HGB 12.2* 10.2* 10.4* 9.8*  HCT 36.4* 30.7* 30.9* 29.2*  MCV 66.5* 67.2* 66.6* 66.5*  PLT 146* 120* 130* 156   Cardiac Enzymes:  Recent Labs Lab 12/17/12 1250  TROPONINI <0.30   BNP: BNP (last 3 results) No results found for this basename: PROBNP,  in the last 8760 hours CBG:  Recent Labs Lab 12/19/12 0838 12/19/12 1203 12/19/12 1620 12/19/12 2049 12/20/12 0742  GLUCAP 229* 195* 214* 165* 176*    Time coordinating discharge: 45 minutes  Signed:  Lee-Ann Gal  [facility] 12/20/2012, 10:09 AM

## 2012-12-20 NOTE — Progress Notes (Signed)
Jimmy Cruz to be D/C'd Home per MD order.  Discussed with the patient and all questions fully answered.    Medication List    STOP taking these medications       sitaGLIPtin 50 MG tablet  Commonly known as:  JANUVIA      TAKE these medications       benzonatate 200 MG capsule  Commonly known as:  TESSALON  Take 1 capsule (200 mg total) by mouth 3 (three) times daily as needed for cough.     insulin aspart 100 UNIT/ML Sopn FlexPen  Commonly known as:  novoLOG  Inject 5 Units into the skin 3 (three) times daily with meals.     Insulin Glargine 100 UNIT/ML Sopn  Commonly known as:  LANTUS SOLOSTAR  Inject 20 Units into the skin daily.     levofloxacin 750 MG tablet  Commonly known as:  LEVAQUIN  Take 1 tablet (750 mg total) by mouth daily.     metFORMIN 500 MG tablet  Commonly known as:  GLUCOPHAGE  Take 500 mg by mouth 2 (two) times daily with a meal.        VVS, Skin clean, dry and intact without evidence of skin break down, no evidence of skin tears noted. IV catheter discontinued intact. Site without signs and symptoms of complications. Dressing and pressure applied.  An After Visit Summary was printed and given to the patient. Patient escorted via WC, and D/C home via private auto.  Jimmy Cruz 12/20/2012 8:56 PM

## 2012-12-21 NOTE — Care Management Note (Unsigned)
    Page 1 of 1   12/21/2012     11:31:01 AM   CARE MANAGEMENT NOTE 12/21/2012  Patient:  Jimmy Cruz, Jimmy Cruz   Account Number:  [id number]  Date Initiated:  12/18/2012  Documentation initiated by:  Donn Pierini  Subjective/Objective Assessment:   Pt admitted with sepsis     Action/Plan:   PTA pt lived at home with spouse- NCM to follow for d/c needs   Anticipated DC Date:  12/21/2012   Anticipated DC Plan:  HOME/SELF CARE      DC Planning Services  CM consult      Choice offered to / List presented to:             Status of service:  Completed, signed off Medicare Important Message given?   (If response is "NO", the following Medicare IM given date fields will be blank) Date Medicare IM given:   Date Additional Medicare IM given:    Discharge Disposition:  HOME/SELF CARE  Per UR Regulation:  Reviewed for med. necessity/level of care/duration of stay  If discussed at Long Length of Stay Meetings, dates discussed:    Comments:  12/20/12 1730 TC from RN taking care of pt. to make NCM aware of new home health orders.  This NCM made TC to pt. room to ask about home health servcies.  Pt. states he is interested, however, he wanted NCM to TC his spouse.  TC to spouse, Karin Golden, [phone]), to inquire of home health services for pt, and she stated she would call back if she wanted to accept servcies.  Maury Dus this NCM contact information if interested.  Pt. had dc orders home. Tera Mater, RN, BSN NCM [phone]

## 2012-12-23 LAB — CULTURE, BLOOD (ROUTINE X 2): Culture: NO GROWTH

## 2014-05-27 IMAGING — CT CT ANGIO CHEST
2 of 7 series · 19 of 36 positions shown · IV contrast (APPLIED)
Comparison: Chest x-ray today

CLINICAL DATA: Hemoptysis.

CT ANGIOGRAPHY CHEST
TECHNIQUE: Multidetector CT imaging of the chest using the
standard protocol during bolus administration of intravenous
contrast. Multiplanar reconstructed images including MIPs were
obtained and reviewed to evaluate the vascular anatomy.
Contrast: 100mL OMNIPAQUE IOHEXOL 350 MG/ML SOLN

[Series 5: pe 1.0 b26f · axial · 0.70mm/px · z∈[-290,-61]mm · 18 of 255 slices shown]
[im 13/255  lung]
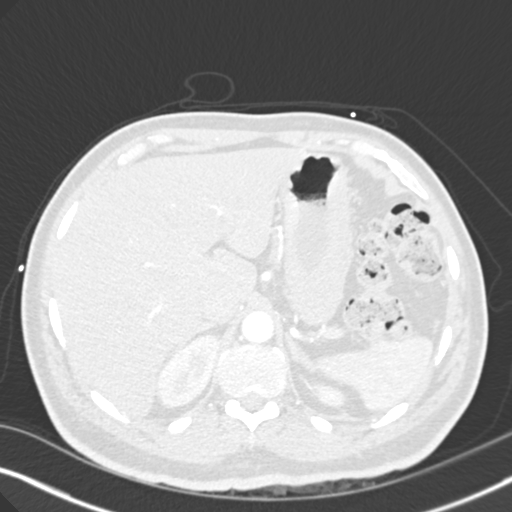
[im 26/255  mediastinal]
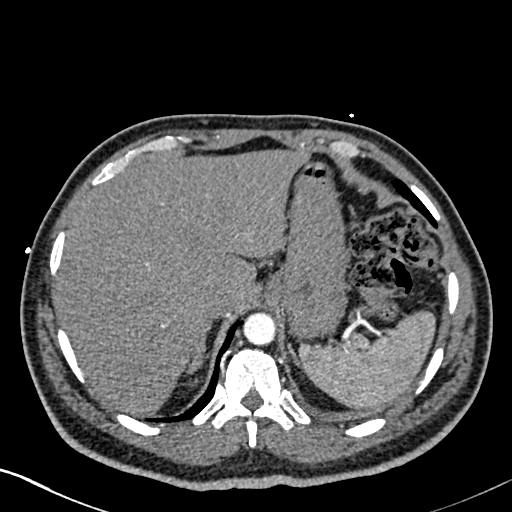
[im 39/255  lung]
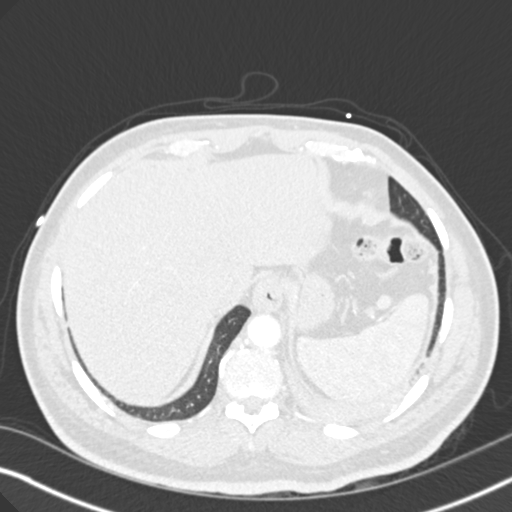
[im 51/255  mediastinal]
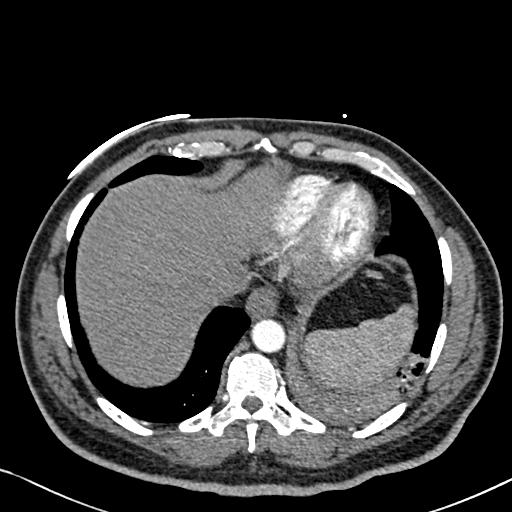
[im 64/255  lung]
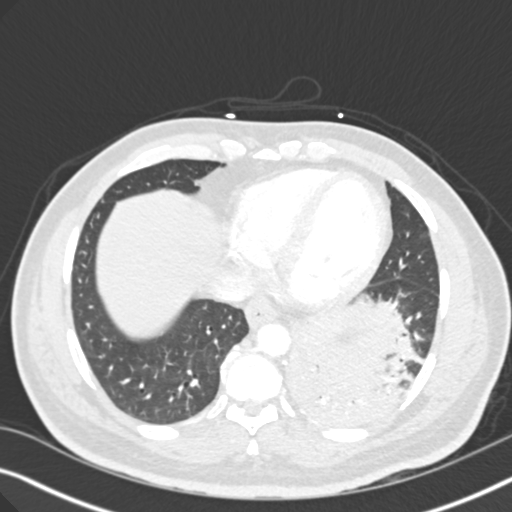
[im 77/255  mediastinal]
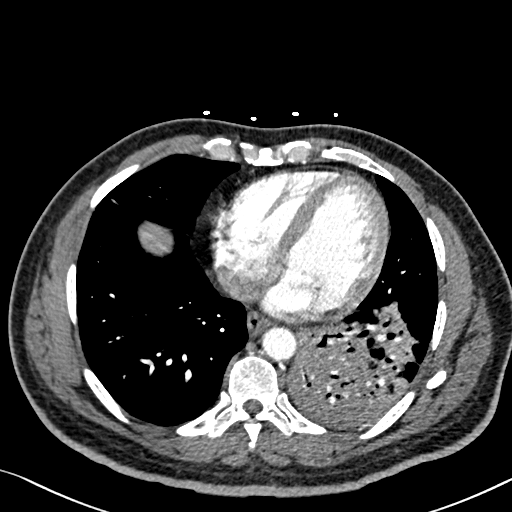
[im 89/255  lung]
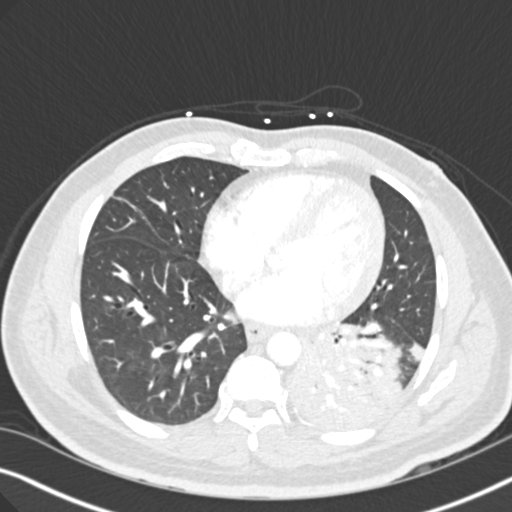
[im 102/255  mediastinal]
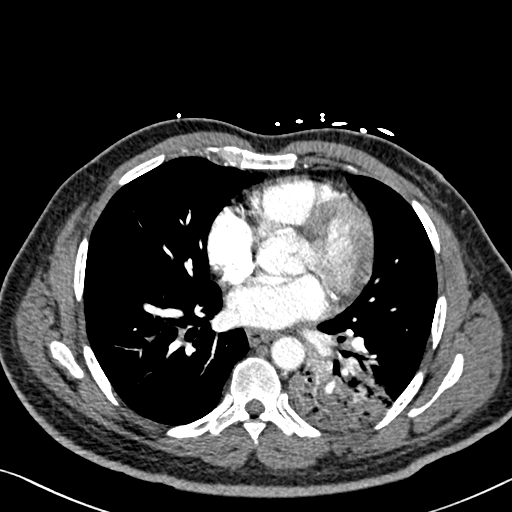
[im 115/255  lung]
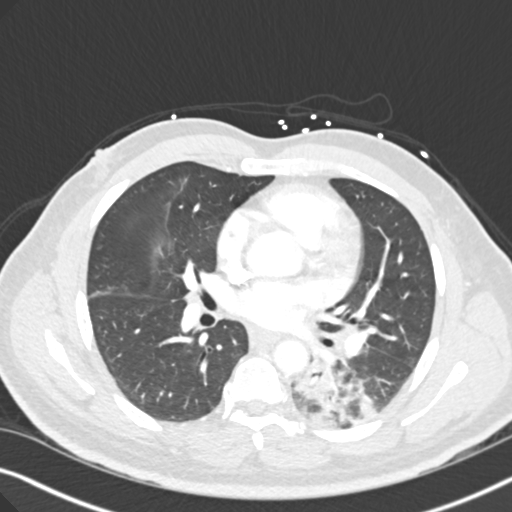
[im 140/255  mediastinal]
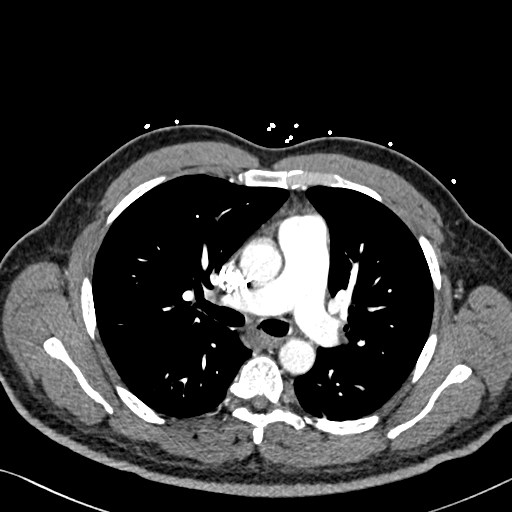
[im 153/255  lung]
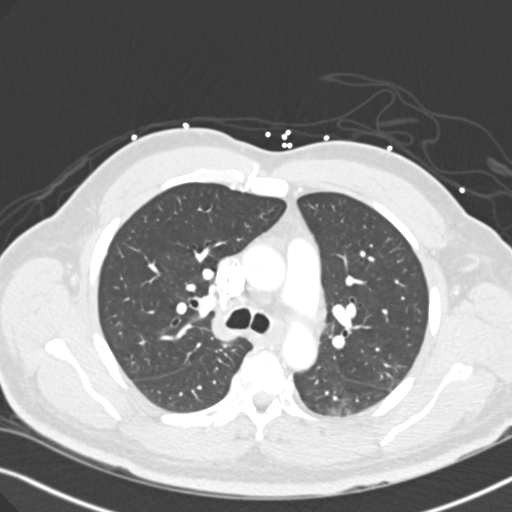
[im 166/255  mediastinal]
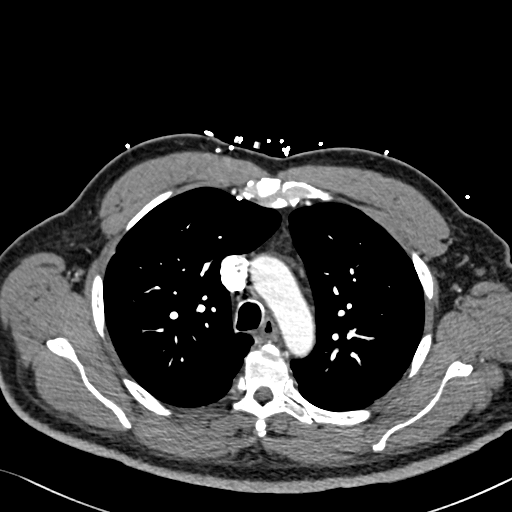
[im 178/255  lung]
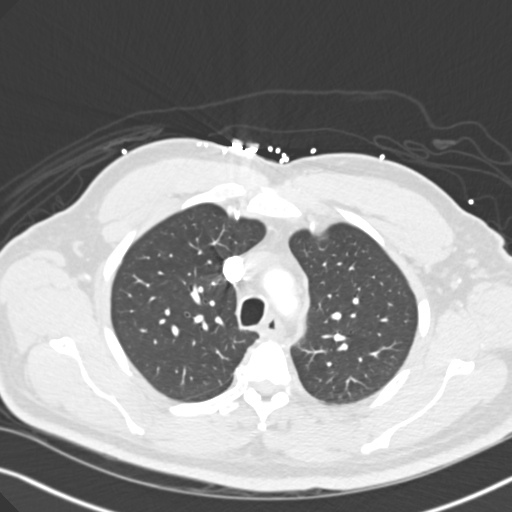
[im 191/255  mediastinal]
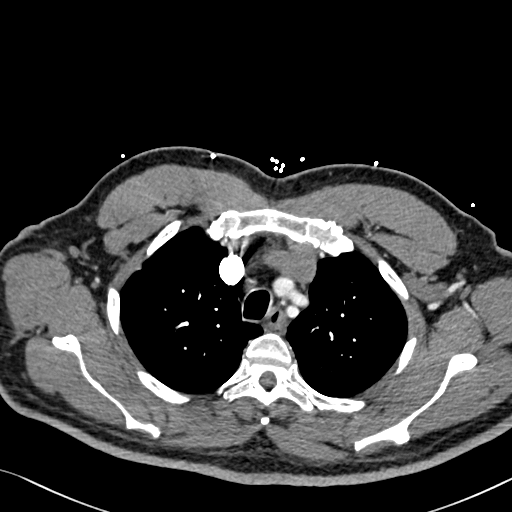
[im 204/255  lung]
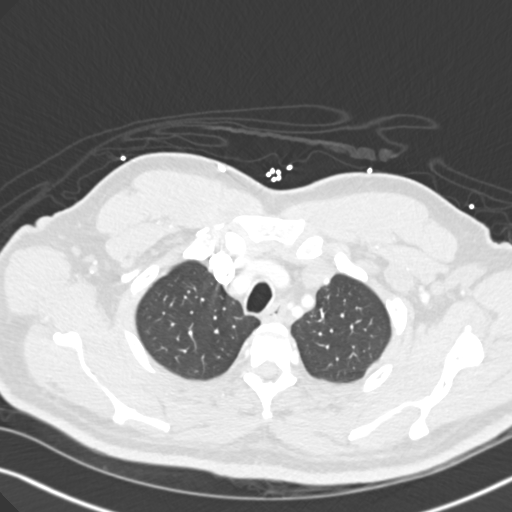
[im 216/255  mediastinal]
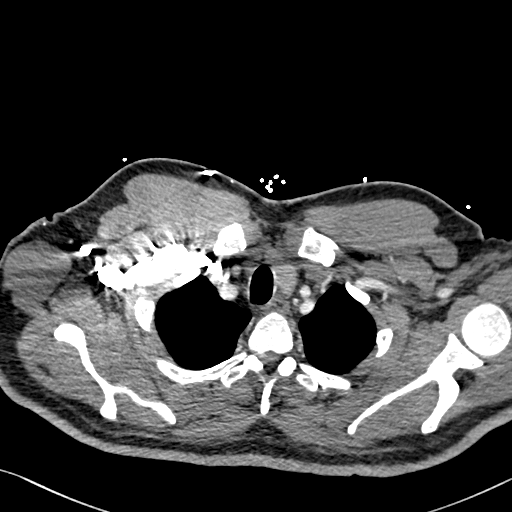
[im 229/255  lung]
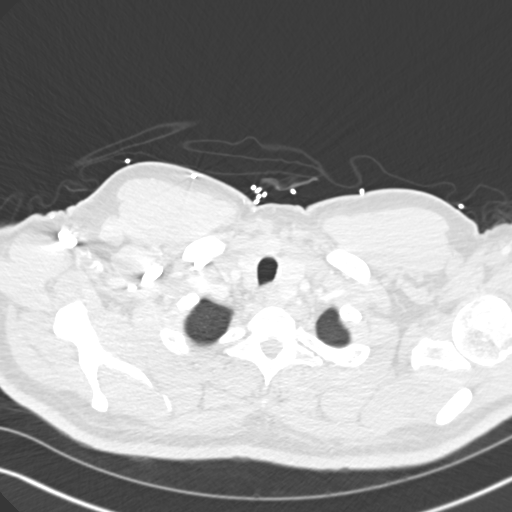
[im 242/255  mediastinal]
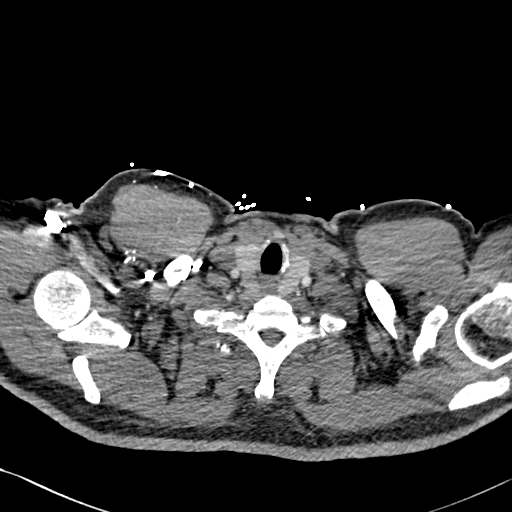

[Series 8: pe 2.0 coronal · coronal · 0.72mm/px · 1 of 110 slices shown]
[im 55/110  mediastinal]
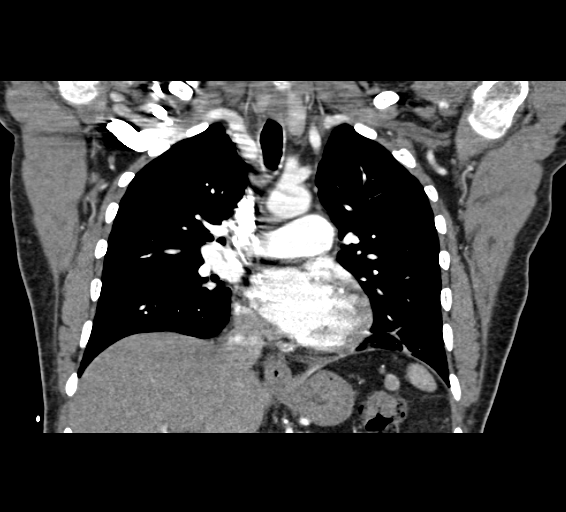

[19 of 36 positions shown; findings below may reference images not displayed]

FINDINGS: Lungs are well inflated and demonstrate moderate
consolidation with air bronchograms over most of the basilar
segments of the left lower lobe.  No evidence of effusion.
Findings are likely due to infection.  Heart is normal in size.
There is no evidence of pulmonary embolism.  There is no
mediastinal, hilar or axillary adenopathy.  Remaining mediastinal
structures are unremarkable.  Images through the upper abdomen are
within normal.
IMPRESSION: Moderate consolidation with air bronchograms over the left lower
lobe likely infection. Recommend follow-up to resolution.

No evidence of pulmonary embolism.

## 2014-05-27 IMAGING — CR DG CHEST 2V
2 series · 2 of 2 positions shown · non-contrast
Comparison: None.

CLINICAL DATA: Hemoptysis.

CHEST - 2 VIEW

[w chest pa]
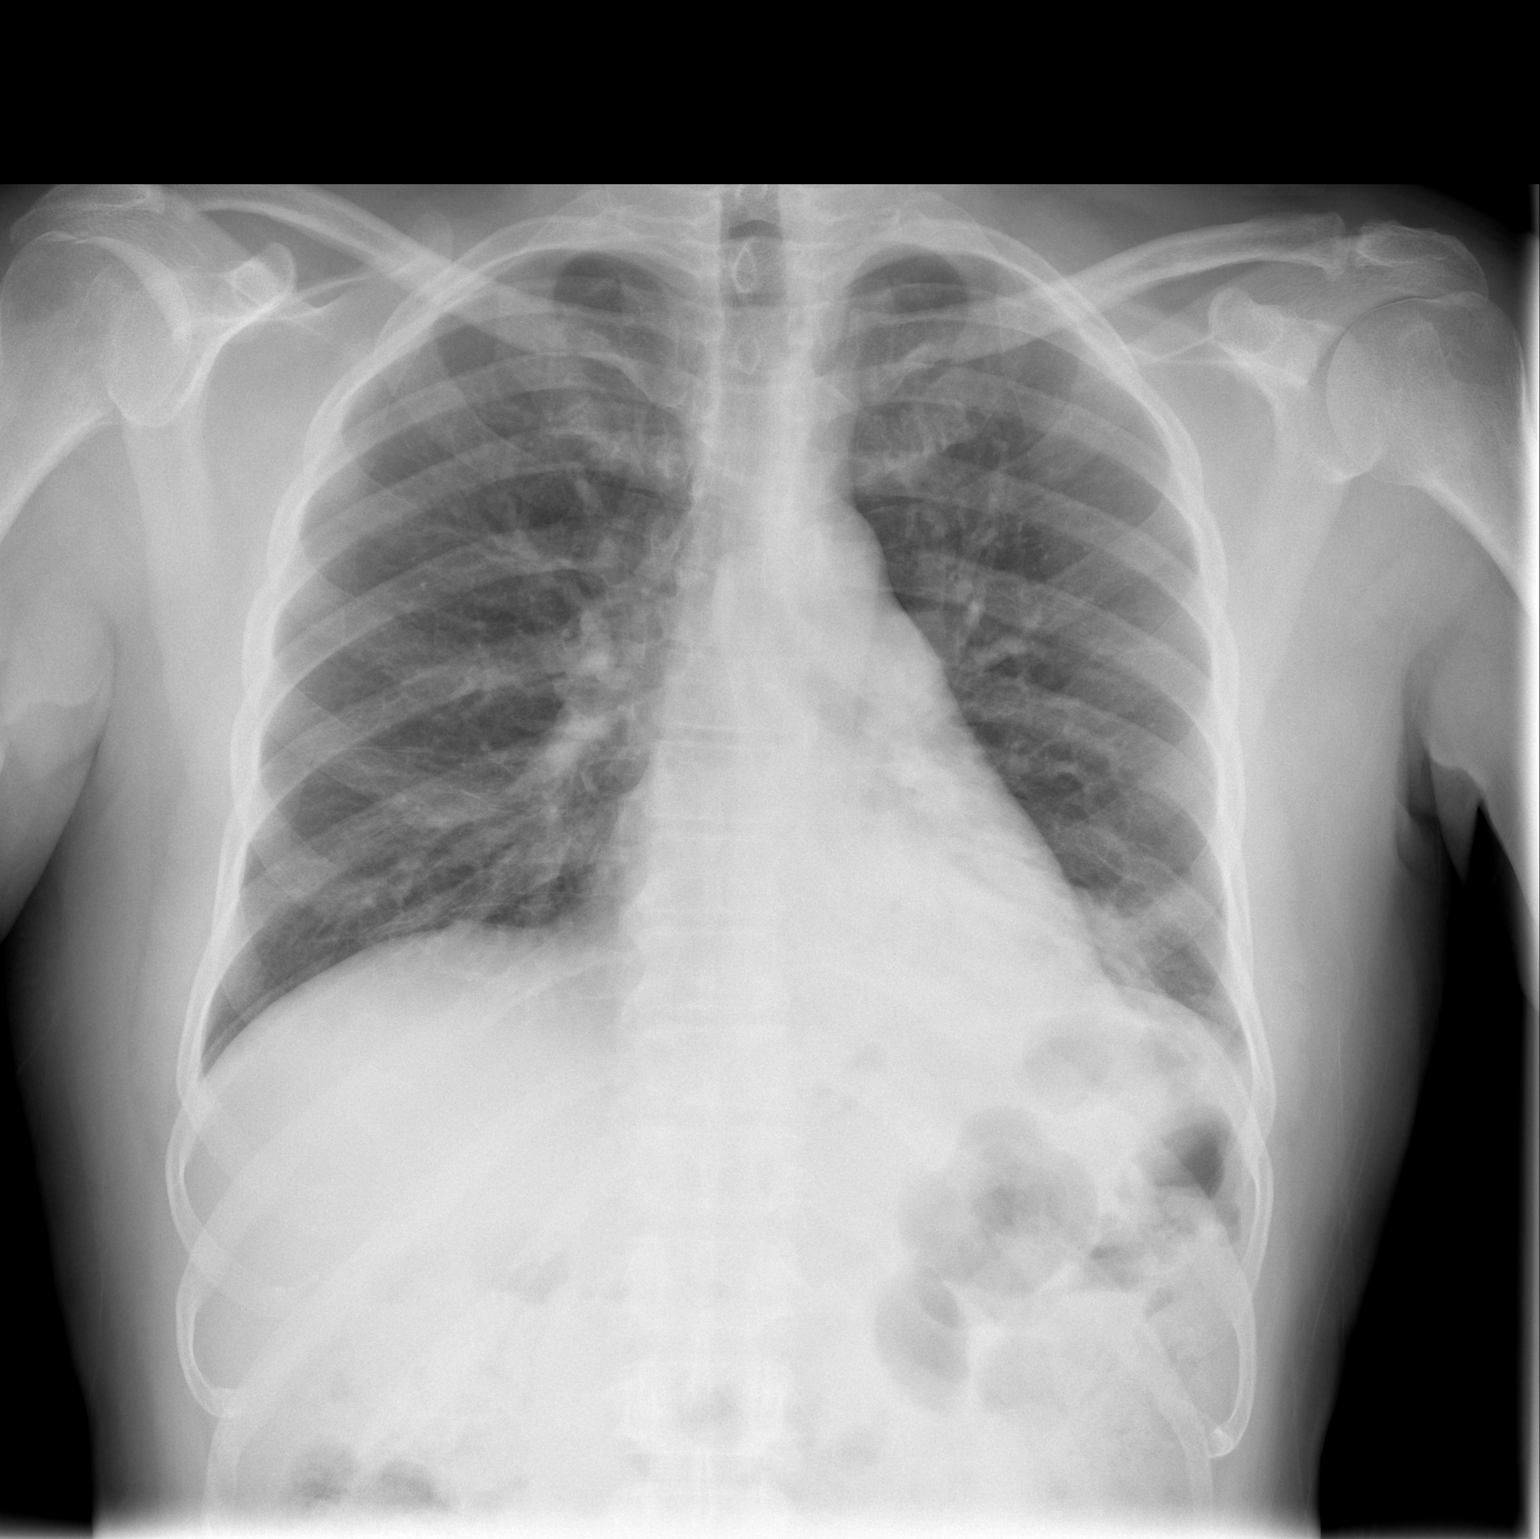

[w chest lat]
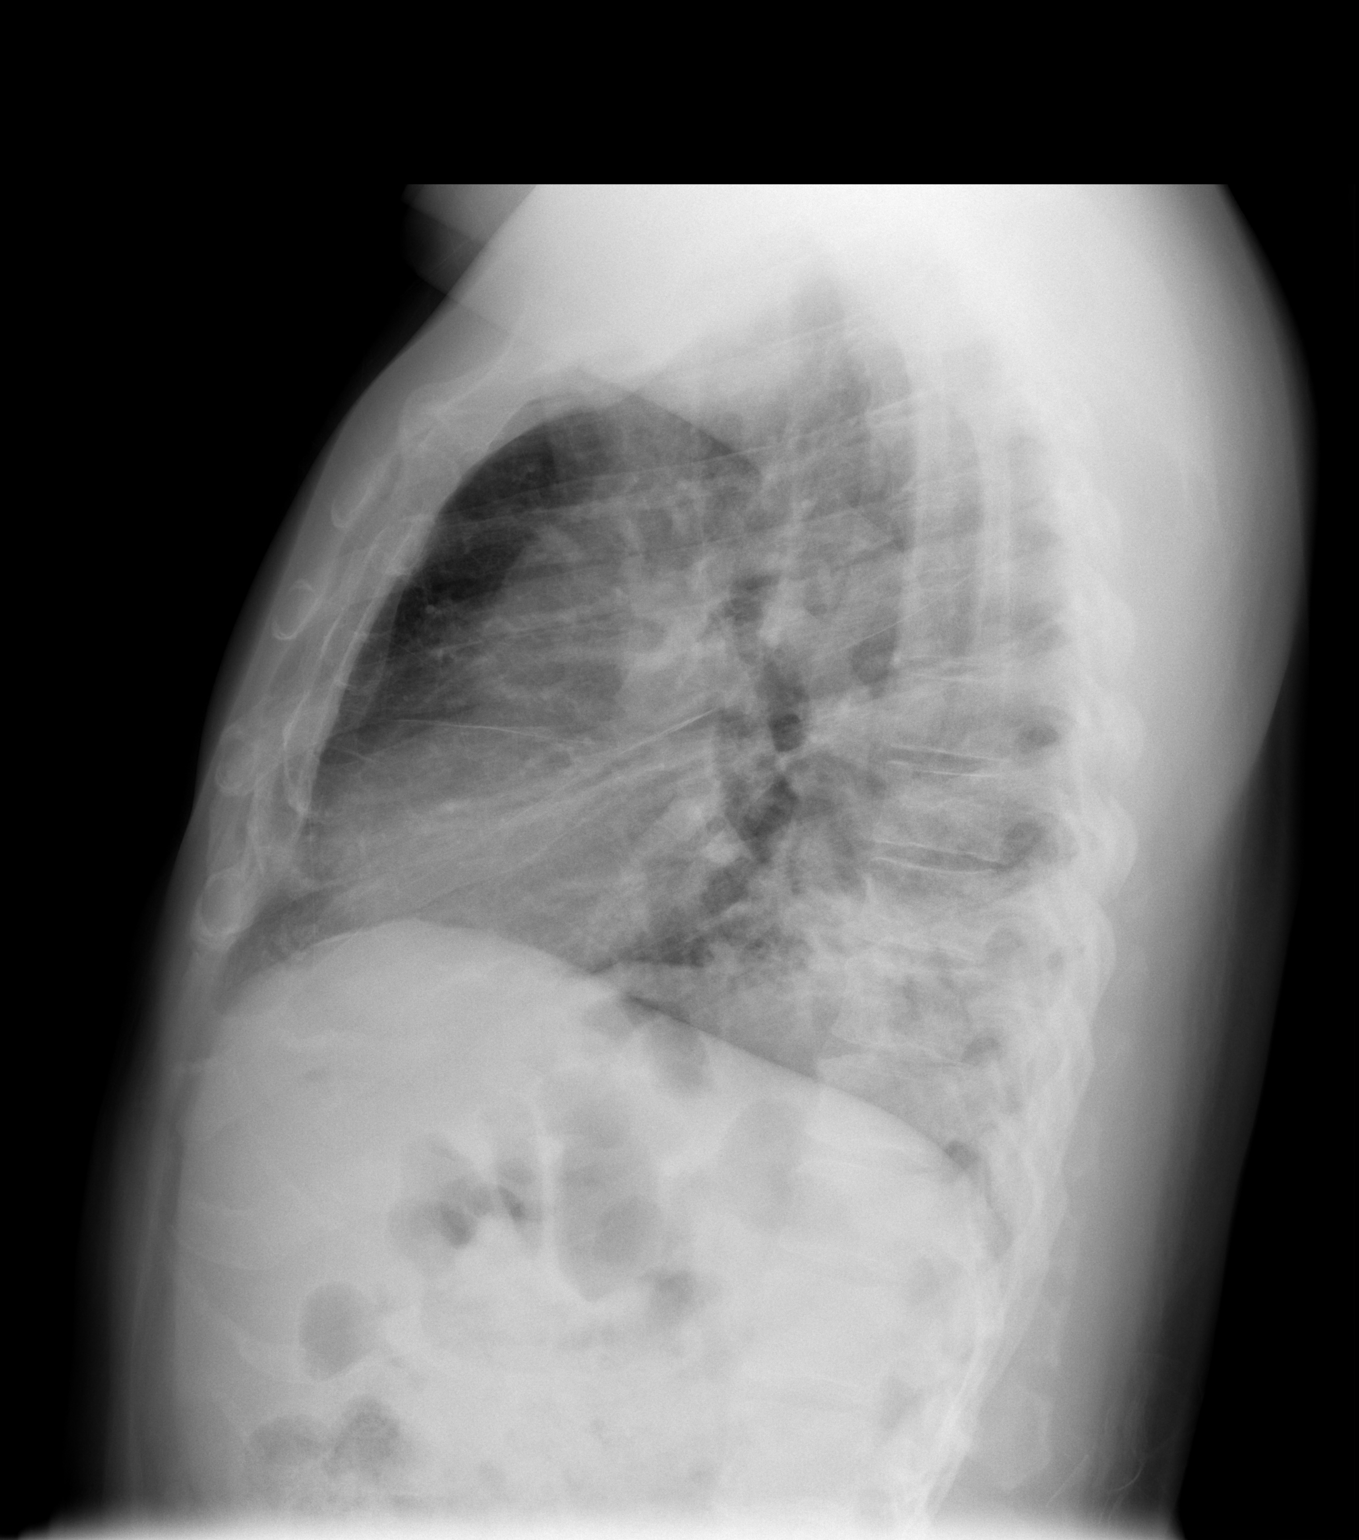

[2 of 2 positions shown; findings below may reference images not displayed]

FINDINGS: Lungs are adequately inflated with consolidation over the
posterior left lower lobe.  The no definite effusion and no
evidence of pneumothorax.  Cardiomediastinal silhouette is within
normal.  Remaining bones and soft tissues are within normal.
IMPRESSION: Nonspecific consolidation over the posterior left lower lobe.  This
may be due to infection versus hemorrhage and less likely neoplasm
or infarction.

## 2014-11-20 ENCOUNTER — Ambulatory Visit: Payer: Self-pay | Admitting: Physician Assistant

## 2014-11-25 ENCOUNTER — Telehealth: Payer: Self-pay | Admitting: *Deleted

## 2014-11-25 NOTE — Telephone Encounter (Signed)
Unable to reach patient at time of Pre-Visit Call.  Patient's mailbox is full, unable to leave message.

## 2014-11-26 ENCOUNTER — Telehealth: Payer: Self-pay | Admitting: Physician Assistant

## 2014-11-26 ENCOUNTER — Ambulatory Visit: Payer: 59 | Admitting: Physician Assistant

## 2014-11-29 NOTE — Telephone Encounter (Signed)
No charge. 

## 2014-11-29 NOTE — Telephone Encounter (Signed)
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
